# Patient Record
Sex: Male | Born: 1990 | Race: Black or African American | Hispanic: No | Marital: Single | Smoking: Current every day smoker
Health system: Southern US, Community
[De-identification: ages and names within clinical notes are randomized; demographics above are authoritative.]

## PROBLEM LIST (undated history)

## (undated) HISTORY — PX: JOINT REPLACEMENT: SHX530

---
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
## 2009-05-13 ENCOUNTER — Emergency Department (HOSPITAL_COMMUNITY): Admission: EM | Admit: 2009-05-13 | Discharge: 2009-05-13 | Payer: Self-pay | Admitting: Family Medicine
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
## 2011-07-16 ENCOUNTER — Emergency Department (HOSPITAL_COMMUNITY): Payer: No Typology Code available for payment source

## 2011-07-16 ENCOUNTER — Encounter (HOSPITAL_COMMUNITY): Payer: Self-pay | Admitting: Emergency Medicine

## 2011-07-16 ENCOUNTER — Emergency Department (HOSPITAL_COMMUNITY)
Admission: EM | Admit: 2011-07-16 | Discharge: 2011-07-16 | Disposition: A | Discharge status: Home / Self Care | Payer: No Typology Code available for payment source | Attending: Emergency Medicine | Admitting: Emergency Medicine

## 2011-07-16 DIAGNOSIS — S8000XA Contusion of unspecified knee, initial encounter: Secondary | ICD-10-CM | POA: Insufficient documentation

## 2011-07-16 DIAGNOSIS — Y93I9 Activity, other involving external motion: Secondary | ICD-10-CM | POA: Insufficient documentation

## 2011-07-16 DIAGNOSIS — Y998 Other external cause status: Secondary | ICD-10-CM | POA: Insufficient documentation

## 2011-07-16 DIAGNOSIS — R079 Chest pain, unspecified: Secondary | ICD-10-CM | POA: Insufficient documentation

## 2011-07-16 DIAGNOSIS — F17200 Nicotine dependence, unspecified, uncomplicated: Secondary | ICD-10-CM | POA: Insufficient documentation

## 2011-07-16 MED ORDER — LIDOCAINE VISCOUS 2 % MT SOLN: 20.0000 mL | Freq: Once | OROMUCOSAL | Status: DC
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
## 2011-07-17 NOTE — ED Provider Notes (Signed)
Medical screening examination/treatment/procedure(s) were performed by non-physician practitioner and as supervising physician I was immediately available for consultation/collaboration.  Waylen Depaolo, MD 07/17/11 1509 

## 2012-09-12 ENCOUNTER — Emergency Department (HOSPITAL_COMMUNITY): Payer: Self-pay

## 2012-09-12 ENCOUNTER — Emergency Department (HOSPITAL_COMMUNITY)
Admission: EM | Admit: 2012-09-12 | Discharge: 2012-09-12 | Disposition: A | Discharge status: Home / Self Care | Payer: Self-pay | Attending: Emergency Medicine | Admitting: Emergency Medicine

## 2012-09-12 ENCOUNTER — Encounter (HOSPITAL_COMMUNITY): Payer: Self-pay | Admitting: *Deleted

## 2012-09-12 DIAGNOSIS — E669 Obesity, unspecified: Secondary | ICD-10-CM | POA: Insufficient documentation

## 2012-09-12 DIAGNOSIS — J3489 Other specified disorders of nose and nasal sinuses: Secondary | ICD-10-CM | POA: Insufficient documentation

## 2012-09-12 DIAGNOSIS — R1110 Vomiting, unspecified: Secondary | ICD-10-CM | POA: Insufficient documentation

## 2012-09-12 DIAGNOSIS — D72829 Elevated white blood cell count, unspecified: Secondary | ICD-10-CM | POA: Insufficient documentation

## 2012-09-12 DIAGNOSIS — J029 Acute pharyngitis, unspecified: Secondary | ICD-10-CM | POA: Insufficient documentation

## 2012-09-12 DIAGNOSIS — R509 Fever, unspecified: Secondary | ICD-10-CM | POA: Insufficient documentation

## 2012-09-12 DIAGNOSIS — F17200 Nicotine dependence, unspecified, uncomplicated: Secondary | ICD-10-CM | POA: Insufficient documentation

## 2012-09-12 LAB — CBC WITH DIFFERENTIAL/PLATELET
Basophils Absolute: 0 10*3/uL (ref 0.0–0.1)
HCT: 43.9 % (ref 39.0–52.0)
Hemoglobin: 15.8 g/dL (ref 13.0–17.0)
Lymphocytes Relative: 7 % — ABNORMAL LOW (ref 12–46)
Lymphs Abs: 1.6 10*3/uL (ref 0.7–4.0)
MCV: 81.8 fL (ref 78.0–100.0)
Monocytes Absolute: 1.4 10*3/uL — ABNORMAL HIGH (ref 0.1–1.0)
Neutro Abs: 19.4 10*3/uL — ABNORMAL HIGH (ref 1.7–7.7)
RBC: 5.37 MIL/uL (ref 4.22–5.81)
RDW: 12.6 % (ref 11.5–15.5)
WBC: 22.5 10*3/uL — ABNORMAL HIGH (ref 4.0–10.5)

## 2012-09-12 LAB — COMPREHENSIVE METABOLIC PANEL
ALT: 16 U/L (ref 0–53)
AST: 15 U/L (ref 0–37)
CO2: 27 mEq/L (ref 19–32)
Chloride: 100 mEq/L (ref 96–112)
Creatinine, Ser: 0.91 mg/dL (ref 0.50–1.35)
GFR calc Af Amer: >90 mL/min (ref >90)
GFR calc non Af Amer: >90 mL/min (ref >90)
Glucose, Bld: 102 mg/dL — ABNORMAL HIGH (ref 70–99)
Total Bilirubin: 0.6 mg/dL (ref 0.3–1.2)

## 2012-09-12 MED ORDER — AZITHROMYCIN 250 MG PO TABS
500.0000 mg | ORAL_TABLET | Freq: Once | ORAL | Status: AC
  Administered 2012-09-12: 500 mg via ORAL
  Filled 2012-09-12: qty 2

## 2012-09-12 MED ORDER — AZITHROMYCIN 250 MG PO TABS: ORAL_TABLET | ORAL | Status: AC

## 2012-09-12 NOTE — ED Notes (Signed)
Reports onset last night of cough and vomiting. No distress noted at triage.

## 2012-09-12 NOTE — ED Provider Notes (Addendum)
History     CSN: [id number]  Arrival date & time 09/12/12  1830   First MD Initiated Contact with Patient 09/12/12 1911      Chief Complaint  Patient presents with  . Emesis  . Cough    (Consider location/radiation/quality/duration/timing/severity/associated sxs/prior treatment) HPI Complains of cough, nonproductive, nasal congestion and sore throat onset 3 AM today. Treated with ibuprofen with partial relief 2 episodes of posttussive vomiting. No other complaint. No shortness of breath. Nothing makes symptoms better or worse. History reviewed. No pertinent past medical history.  Past Surgical History  Procedure Laterality Date  . Joint replacement     no history of joint replacement, history of tendon repair left knee  History reviewed. No pertinent family history.  History  Substance Use Topics  . Smoking status: Current Every Day Smoker  . Smokeless tobacco: Not on file  . Alcohol Use: Yes   No illicit drug use   Review of Systems  HENT: Positive for congestion and sore throat.        Nasal congestion  Respiratory: Positive for cough.   Gastrointestinal: Positive for vomiting.        2 episodes of posttussive vomiting    Allergies  Tylenol  Home Medications   Current Outpatient Rx  Name  Route  Sig  Dispense  Refill  . ibuprofen (ADVIL,MOTRIN) 200 MG tablet   Oral   Take 400 mg by mouth every 6 (six) hours as needed. For headache/pain           BP 139/81  Pulse 112  Temp(Src) 100.5 F (38.1 C) (Oral)  Resp 19  SpO2 98%  Physical Exam  Nursing note and vitals reviewed. Constitutional: He appears well-developed and well-nourished. No distress.  HENT:  Head: Normocephalic and atraumatic.  Oral pharynx reddened, uvula midline, no exudate  Eyes: Conjunctivae are normal. Pupils are equal, round, and reactive to light.  Neck: Neck supple. No tracheal deviation present. No thyromegaly present.  Cardiovascular: Normal rate and regular rhythm.   No  murmur heard. Pulmonary/Chest: Effort normal and breath sounds normal. No respiratory distress.  Occasional cough  Abdominal: Soft. Bowel sounds are normal. He exhibits no distension. There is no tenderness.  OBese  Musculoskeletal: Normal range of motion. He exhibits no edema and no tenderness.  Lymphadenopathy:    He has no cervical adenopathy.  Neurological: He is alert. Coordination normal.  Skin: Skin is warm and dry. No rash noted.  Psychiatric: He has a normal mood and affect.    ED Course  Procedures (including critical care time)  Labs Reviewed  CBC WITH DIFFERENTIAL - Abnormal; Notable for the following:    WBC 22.5 (*)    Neutrophils Relative % 86 (*)    Neutro Abs 19.4 (*)    Lymphocytes Relative 7 (*)    Monocytes Absolute 1.4 (*)    All other components within normal limits  COMPREHENSIVE METABOLIC PANEL  LIPASE, BLOOD   No results found.   No diagnosis found. Results for orders placed during the hospital encounter of 09/12/12  CBC WITH DIFFERENTIAL      Result Value Range   WBC 22.5 (*) 4.0 - 10.5 K/uL   RBC 5.37  4.22 - 5.81 MIL/uL   Hemoglobin 15.8  13.0 - 17.0 g/dL   HCT 40.9  81.1 - 91.4 %   MCV 81.8  78.0 - 100.0 fL   MCH 29.4  26.0 - 34.0 pg   MCHC 36.0  30.0 - 36.0 g/dL  RDW 12.6  11.5 - 15.5 %   Platelets 218  150 - 400 K/uL   Neutrophils Relative % 86 (*) 43 - 77 %   Neutro Abs 19.4 (*) 1.7 - 7.7 K/uL   Lymphocytes Relative 7 (*) 12 - 46 %   Lymphs Abs 1.6  0.7 - 4.0 K/uL   Monocytes Relative 6  3 - 12 %   Monocytes Absolute 1.4 (*) 0.1 - 1.0 K/uL   Eosinophils Relative 1  0 - 5 %   Eosinophils Absolute 0.1  0.0 - 0.7 K/uL   Basophils Relative 0  0 - 1 %   Basophils Absolute 0.0  0.0 - 0.1 K/uL  COMPREHENSIVE METABOLIC PANEL      Result Value Range   Sodium 138  135 - 145 mEq/L   Potassium 3.9  3.5 - 5.1 mEq/L   Chloride 100  96 - 112 mEq/L   CO2 27  19 - 32 mEq/L   Glucose, Bld 102 (*) 70 - 99 mg/dL   BUN 11  6 - 23 mg/dL    Creatinine, Ser 1.61  0.50 - 1.35 mg/dL   Calcium 09.6  8.4 - 04.5 mg/dL   Total Protein 8.2  6.0 - 8.3 g/dL   Albumin 4.2  3.5 - 5.2 g/dL   AST 15  0 - 37 U/L   ALT 16  0 - 53 U/L   Alkaline Phosphatase 122 (*) 39 - 117 U/L   Total Bilirubin 0.6  0.3 - 1.2 mg/dL   GFR calc non Af Amer >90  >90 mL/min   GFR calc Af Amer >90  >90 mL/min  LIPASE, BLOOD      Result Value Range   Lipase 11  11 - 59 U/L   Dg Chest 2 View  09/12/2012   *RADIOLOGY REPORT*  Clinical Data: Emesis and cough  CHEST - 2 VIEW  Comparison: 07/16/2011  Findings: The heart size and mediastinal contours are within normal limits.  Both lungs are clear.  The visualized skeletal structures are unremarkable.  IMPRESSION: Negative exam.   Original Report Authenticated By: Signa Kell, M.D.     Chest x-ray viewed by me MDM  In light of leukocytosis treat for atypical pneumonia with patient vomiting. Posttussive vomiting possibly suggestive of pertussis. Patient is not ill-appearing and degree of leukocytosis does not correlate clinically to his illness And prescription Zithromax Referral resource guide Spent 5 minutes counseling patient on smoking cessation Diagnosis #1 febrile illness #2 tobacco abuse #3 leukocytosis        Doug Sou, MD 09/12/12 2128  Doug Sou, MD 09/12/12 2129  Doug Sou, MD 09/12/12 2129

## 2012-09-12 NOTE — ED Notes (Signed)
Patient states he started feeling bad yesterday.  Stated his SO had the same thing last Thursday and now he is feeling bad.  +cough and states that sometimes when he coughs he coughs so hard that he vomits.  Lungs clear bilaterally.  Sounds congested.

## 2013-10-12 ENCOUNTER — Encounter (HOSPITAL_COMMUNITY): Payer: Self-pay | Admitting: Emergency Medicine

## 2013-10-12 ENCOUNTER — Emergency Department (HOSPITAL_COMMUNITY)
Admission: EM | Admit: 2013-10-12 | Discharge: 2013-10-12 | Disposition: A | Discharge status: Home / Self Care | Payer: Self-pay | Attending: Emergency Medicine | Admitting: Emergency Medicine

## 2013-10-12 DIAGNOSIS — F17200 Nicotine dependence, unspecified, uncomplicated: Secondary | ICD-10-CM | POA: Insufficient documentation

## 2013-10-12 DIAGNOSIS — J029 Acute pharyngitis, unspecified: Secondary | ICD-10-CM

## 2013-10-12 MED ORDER — AZITHROMYCIN 250 MG PO TABS
250.0000 mg | ORAL_TABLET | Freq: Every day | ORAL | Status: AC
Start: 2013-10-12 — End: ?

## 2013-10-12 NOTE — Discharge Instructions (Signed)
Sore Throat A sore throat is pain, burning, irritation, or scratchiness of the throat. There is often pain or tenderness when swallowing or talking. A sore throat may be accompanied by other symptoms, such as coughing, sneezing, fever, and swollen neck glands. A sore throat is often the first sign of another sickness, such as a cold, flu, strep throat, or mononucleosis (commonly known as mono). Most sore throats go away without medical treatment. CAUSES  The most common causes of a sore throat include:  A viral infection, such as a cold, flu, or mono.  A bacterial infection, such as strep throat, tonsillitis, or whooping cough.  Seasonal allergies.  Dryness in the air.  Irritants, such as smoke or pollution.  Gastroesophageal reflux disease (GERD). HOME CARE INSTRUCTIONS   Only take over-the-counter medicines as directed by your caregiver.  Drink enough fluids to keep your urine clear or pale yellow.  Rest as needed.  Try using throat sprays, lozenges, or sucking on hard candy to ease any pain (if older than 4 years or as directed).  Sip warm liquids, such as broth, herbal tea, or warm water with honey to relieve pain temporarily. You may also eat or drink cold or frozen liquids such as frozen ice pops.  Gargle with salt water (mix 1 tsp salt with 8 oz of water).  Do not smoke and avoid secondhand smoke.  Put a cool-mist humidifier in your bedroom at night to moisten the air. You can also turn on a hot shower and sit in the bathroom with the door closed for 5 10 minutes. SEEK IMMEDIATE MEDICAL CARE IF:  You have difficulty breathing.  You are unable to swallow fluids, soft foods, or your saliva.  You have increased swelling in the throat.  Your sore throat does not get better in 7 days.  You have nausea and vomiting.  You have a fever or persistent symptoms for more than 2 3 days.  You have a fever and your symptoms suddenly get worse. MAKE SURE YOU:   Understand  these instructions.  Will watch your condition.  Will get help right away if you are not doing well or get worse. Document Released: 05/23/2004 Document Revised: 04/01/2012 Document Reviewed: 12/22/2011 [employer] Patient Information 2014 [employer].  

## 2013-10-12 NOTE — ED Provider Notes (Signed)
CSN: [id number]     Arrival date & time 10/12/13  1030 History  This chart was scribed for non-physician practitioner, Ivar Drapeob Browning, working with Joya Gaskinsonald W Wickline, MD by Milly JakobJohn Lee Graves, ED Scribe. The patient was seen in room TR06C/TR06C. Patient's care was started at 11:04 AM.     Chief Complaint  Patient presents with  . Sore Throat  . Chills   The history is provided by the patient. No language interpreter was used.   HPI Comments: Hector Porter is a 23 y.o. male who presents to the Emergency Department with a chief complaint of sore throat, and ear congestion. He states symptoms have been ongoing for the past 2 days. He rates his pain as a 4/10. He states that the pain has been progressively worsening. He reports associated fevers and chills, but did not take a temperature. He is tried taking ibuprofen with some relief. He denies cough or congestion.  History reviewed. No pertinent past medical history. Past Surgical History  Procedure Laterality Date  . Joint replacement     History reviewed. No pertinent family history. History  Substance Use Topics  . Smoking status: Current Every Day Smoker  . Smokeless tobacco: Not on file  . Alcohol Use: Yes    Review of Systems  Constitutional: Positive for fever and chills.  HENT: Positive for sore throat. Negative for drooling and postnasal drip.   Respiratory: Negative for cough and shortness of breath.   Gastrointestinal: Negative for nausea, vomiting, diarrhea and constipation.      Allergies  Tylenol  Home Medications   Prior to Admission medications   Medication Sig Start Date End Date Taking? Authorizing Provider  azithromycin (ZITHROMAX) 250 MG tablet One by mouth daily 09/12/12   Doug SouSam Jacubowitz, MD  ibuprofen (ADVIL,MOTRIN) 200 MG tablet Take 400 mg by mouth every 6 (six) hours as needed. For headache/pain    Historical Provider, MD   Triage Vitals: BP 155/89  Pulse 85  Temp(Src) 98.3 F (36.8 C) (Oral)  Resp  20  Ht 5\' 10"  (1.778 m)  Wt 287 lb (130.182 kg)  BMI 41.18 kg/m2  SpO2 98% Physical Exam  Nursing note and vitals reviewed. Constitutional: He is oriented to person, place, and time. He appears well-developed and well-nourished. No distress.  HENT:  Head: Normocephalic and atraumatic.  Oropharynx is moderately erythematous, no tonsillar exudates, uvula is midline, airway is intact, left tympanic membrane is moderately erythematous, right is clear  Eyes: Conjunctivae and EOM are normal. Pupils are equal, round, and reactive to light. Right eye exhibits no discharge. Left eye exhibits no discharge. No scleral icterus.  Neck: Normal range of motion. Neck supple. No JVD present. No tracheal deviation present.  Cardiovascular: Normal rate, regular rhythm and normal heart sounds.  Exam reveals no gallop and no friction rub.   No murmur heard. Pulmonary/Chest: Effort normal and breath sounds normal. No respiratory distress. He has no wheezes. He has no rales. He exhibits no tenderness.  Abdominal: Soft. He exhibits no distension and no mass. There is no tenderness. There is no rebound and no guarding.  Musculoskeletal: Normal range of motion. He exhibits no edema and no tenderness.  Neurological: He is alert and oriented to person, place, and time.  Skin: Skin is warm and dry.  Psychiatric: He has a normal mood and affect. His behavior is normal. Judgment and thought content normal.    ED Course  Procedures (including critical care time) DIAGNOSTIC STUDIES: Oxygen Saturation is 98% on  room air, normal by my interpretation.      Labs Review Labs Reviewed - No data to display  Imaging Review No results found.   EKG Interpretation None      MDM   Final diagnoses:  Sore throat    Patient with pharyngitis, with subjective fevers and chills.   I personally performed the services described in this documentation, which was scribed in my presence. The recorded information has been  reviewed and is accurate.     Roxy Horsemanobert Browning, PA-C 10/12/13 1148

## 2013-10-12 NOTE — ED Notes (Signed)
Pt presents to department for evaluation of sore throat and chills. Ongoing x2 days. 4/10 pain at the time. Pt is alert and oriented x4. NAD.

## 2013-10-13 NOTE — ED Provider Notes (Signed)
Medical screening examination/treatment/procedure(s) were performed by non-physician practitioner and as supervising physician I was immediately available for consultation/collaboration.   EKG Interpretation None        Donald W Wickline, MD 10/13/13 2013 

## 2014-02-01 ENCOUNTER — Emergency Department (INDEPENDENT_AMBULATORY_CARE_PROVIDER_SITE_OTHER)
Admission: EM | Admit: 2014-02-01 | Discharge: 2014-02-01 | Disposition: A | Discharge status: Home / Self Care | Payer: Self-pay | Source: Home / Self Care | Attending: Family Medicine | Admitting: Family Medicine

## 2014-02-01 ENCOUNTER — Other Ambulatory Visit (HOSPITAL_COMMUNITY)
Admission: RE | Admit: 2014-02-01 | Discharge: 2014-02-01 | Disposition: A | Discharge status: Home / Self Care | Payer: Self-pay | Source: Ambulatory Visit | Attending: Family Medicine | Admitting: Family Medicine

## 2014-02-01 ENCOUNTER — Encounter (HOSPITAL_COMMUNITY): Payer: Self-pay | Admitting: Emergency Medicine

## 2014-02-01 DIAGNOSIS — Z202 Contact with and (suspected) exposure to infections with a predominantly sexual mode of transmission: Secondary | ICD-10-CM

## 2014-02-01 DIAGNOSIS — Z113 Encounter for screening for infections with a predominantly sexual mode of transmission: Secondary | ICD-10-CM | POA: Insufficient documentation

## 2014-02-01 MED ORDER — CEFTRIAXONE SODIUM 250 MG IJ SOLR
250.0000 mg | Freq: Once | INTRAMUSCULAR | Status: AC
  Administered 2014-02-01: 250 mg via INTRAMUSCULAR

## 2014-02-01 MED ORDER — AZITHROMYCIN 250 MG PO TABS
1000.0000 mg | ORAL_TABLET | Freq: Once | ORAL | Status: AC
  Administered 2014-02-01: 1000 mg via ORAL

## 2014-02-01 MED ORDER — AZITHROMYCIN 250 MG PO TABS
ORAL_TABLET | ORAL | Status: AC
  Filled 2014-02-01: qty 4

## 2014-02-01 MED ORDER — CEFTRIAXONE SODIUM 250 MG IJ SOLR
INTRAMUSCULAR | Status: AC
  Filled 2014-02-01: qty 250

## 2014-02-01 NOTE — Discharge Instructions (Signed)
Sexually Transmitted Disease °A sexually transmitted disease (STD) is a disease or infection that may be passed (transmitted) from person to person, usually during sexual activity. This may happen by way of saliva, semen, blood, vaginal mucus, or urine. Common STDs include:  °· Gonorrhea.   °· Chlamydia.   °· Syphilis.   °· HIV and AIDS.   °· Genital herpes.   °· Hepatitis B and C.   °· Trichomonas.   °· Human papillomavirus (HPV).   °· Pubic lice.   °· Scabies. °· Mites. °· Bacterial vaginosis. °WHAT ARE CAUSES OF STDs? °An STD may be caused by bacteria, a virus, or parasites. STDs are often transmitted during sexual activity if one person is infected. However, they may also be transmitted through nonsexual means. STDs may be transmitted after:  °· Sexual intercourse with an infected person.   °· Sharing sex toys with an infected person.   °· Sharing needles with an infected person or using unclean piercing or tattoo needles. °· Having intimate contact with the genitals, mouth, or rectal areas of an infected person.   °· Exposure to infected fluids during birth. °WHAT ARE THE SIGNS AND SYMPTOMS OF STDs? °Different STDs have different symptoms. Some people may not have any symptoms. If symptoms are present, they may include:  °· Painful or bloody urination.   °· Pain in the pelvis, abdomen, vagina, anus, throat, or eyes.   °· A skin rash, itching, or irritation. °· Growths, ulcerations, blisters, or sores in the genital and anal areas. °· Abnormal vaginal discharge with or without bad odor.   °· Penile discharge in men.   °· Fever.   °· Pain or bleeding during sexual intercourse.   °· Swollen glands in the groin area.   °· Yellow skin and eyes (jaundice). This is seen with hepatitis.   °· Swollen testicles. °· Infertility. °· Sores and blisters in the mouth. °HOW ARE STDs DIAGNOSED? °To make a diagnosis, your health care provider may:  °· Take a medical history.   °· Perform a physical exam.   °· Take a sample of  any discharge to examine. °· Swab the throat, cervix, opening to the penis, rectum, or vagina for testing. °· Test a sample of your first morning urine.   °· Perform blood tests.   °· Perform a Pap test, if this applies.   °· Perform a colposcopy.   °· Perform a laparoscopy.   °HOW ARE STDs TREATED? ° Treatment depends on the STD. Some STDs may be treated but not cured.  °· Chlamydia, gonorrhea, trichomonas, and syphilis can be cured with antibiotic medicine.   °· Genital herpes, hepatitis, and HIV can be treated, but not cured, with prescribed medicines. The medicines lessen symptoms.   °· Genital warts from HPV can be treated with medicine or by freezing, burning (electrocautery), or surgery. Warts may come back.   °· HPV cannot be cured with medicine or surgery. However, abnormal areas may be removed from the cervix, vagina, or vulva.   °· If your diagnosis is confirmed, your recent sexual partners need treatment. This is true even if they are symptom-free or have a negative culture or evaluation. They should not have sex until their health care providers say it is okay. °HOW CAN I REDUCE MY RISK OF GETTING AN STD? °Take these steps to reduce your risk of getting an STD: °· Use latex condoms, dental dams, and water-soluble lubricants during sexual activity. Do not use petroleum jelly or oils. °· Avoid having multiple sex partners. °· Do not have sex with someone who has other sex partners. °· Do not have sex with anyone you do not know or who is at   high risk for an STD. °· Avoid risky sex practices that can break your skin. °· Do not have sex if you have open sores on your mouth or skin. °· Avoid drinking too much alcohol or taking illegal drugs. Alcohol and drugs can affect your judgment and put you in a vulnerable position. °· Avoid engaging in oral and anal sex acts. °· Get vaccinated for HPV and hepatitis. If you have not received these vaccines in the past, talk to your health care provider about whether one  or both might be right for you.   °· If you are at risk of being infected with HIV, it is recommended that you take a prescription medicine daily to prevent HIV infection. This is called pre-exposure prophylaxis (PrEP). You are considered at risk if: °¨ You are a man who has sex with other men (MSM). °¨ You are a heterosexual man or woman and are sexually active with more than one partner. °¨ You take drugs by injection. °¨ You are sexually active with a partner who has HIV. °· Talk with your health care provider about whether you are at high risk of being infected with HIV. If you choose to begin PrEP, you should first be tested for HIV. You should then be tested every 3 months for as long as you are taking PrEP.   °WHAT SHOULD I DO IF I THINK I HAVE AN STD? °· See your health care provider.   °· Tell your sexual partner(s). They should be tested and treated for any STDs. °· Do not have sex until your health care provider says it is okay.  °WHEN SHOULD I GET IMMEDIATE MEDICAL CARE? °Contact your health care provider right away if:  °· You have severe abdominal pain. °· You are a man and notice swelling or pain in your testicles. °· You are a woman and notice swelling or pain in your vagina. °Document Released: 07/06/2002 Document Revised: 04/20/2013 Document Reviewed: 11/03/2012 °ExitCare® Patient Information ©2015 [employer]. This information is not intended to replace advice given to you by your health care provider. Make sure you discuss any questions you have with your health care provider. ° °Safe Sex °Safe sex is about reducing the risk of giving or getting a sexually transmitted disease (STD). STDs are spread through sexual contact involving the genitals, mouth, or rectum. Some STDs can be cured and others cannot. Safe sex can also prevent unintended pregnancies.  °WHAT ARE SOME SAFE SEX PRACTICES? °· Limit your sexual activity to only one partner who is having sex with only you. °· Talk to your partner  about his or her past partners, past STDs, and drug use. °· Use a condom every time you have sexual intercourse. This includes vaginal, oral, and anal sexual activity. Both females and males should wear condoms during oral sex. Only use latex or polyurethane condoms and water-based lubricants. Using petroleum-based lubricants or oils to lubricate a condom will weaken the condom and increase the chance that it will break. The condom should be in place from the beginning to the end of sexual activity. Wearing a condom reduces, but does not completely eliminate, your risk of getting or giving an STD. STDs can be spread by contact with infected body fluids and skin. °· Get vaccinated for hepatitis B and HPV. °· Avoid alcohol and recreational drugs, which can affect your judgment. You may forget to use a condom or participate in high-risk sex. °· For females, avoid douching after sexual intercourse. Douching can spread an infection   farther into the reproductive tract. °· Check your body for signs of sores, blisters, rashes, or unusual discharge. See your health care provider if you notice any of these signs. °· Avoid sexual contact if you have symptoms of an infection or are being treated for an STD. If you or your partner has herpes, avoid sexual contact when blisters are present. Use condoms at all other times. °· If you are at risk of being infected with HIV, it is recommended that you take a prescription medicine daily to prevent HIV infection. This is called pre-exposure prophylaxis (PrEP). You are considered at risk if: °¨ You are a man who has sex with other men (MSM). °¨ You are a heterosexual man or woman who is sexually active with more than one partner. °¨ You take drugs by injection. °¨ You are sexually active with a partner who has HIV. °· Talk with your health care provider about whether you are at high risk of being infected with HIV. If you choose to begin PrEP, you should first be tested for HIV. You  should then be tested every 3 months for as long as you are taking PrEP. °· See your health care provider for regular screenings, exams, and tests for other STDs. Before having sex with a new partner, each of you should be screened for STDs and should talk about the results with each other. °WHAT ARE THE BENEFITS OF SAFE SEX?  °· There is less chance of getting or giving an STD. °· You can prevent unwanted or unintended pregnancies. °· By discussing safe sex concerns with your partner, you may increase feelings of intimacy, comfort, trust, and honesty between the two of you. °Document Released: 05/23/2004 Document Revised: 08/30/2013 Document Reviewed: 10/07/2011 °ExitCare® Patient Information ©2015 [employer]. This information is not intended to replace advice given to you by your health care provider. Make sure you discuss any questions you have with your health care provider. ° °

## 2014-02-01 NOTE — ED Notes (Signed)
Pt  Reports was  Told  By  A  Partner  That  They  Tested  Pos  For  chlymydia  Pt  denys  Any   Symptoms

## 2014-02-01 NOTE — ED Provider Notes (Signed)
CSN: [id number]     Arrival date & time 02/01/14  0940 History   None    Chief Complaint  Patient presents with  . Exposure to STD   (Consider location/radiation/quality/duration/timing/severity/associated sxs/prior Treatment) HPI Comments: Was notified by partner that she tested positive for chlamydia. Patient reports himself to be asymptomatic and otherwise healthy.  PCP: none Works at [employer]  Patient is a 23 y.o. male presenting with STD exposure. The history is provided by the patient.  Exposure to STD This is a new problem.    History reviewed. No pertinent past medical history. Past Surgical History  Procedure Laterality Date  . Joint replacement     History reviewed. No pertinent family history. History  Substance Use Topics  . Smoking status: Current Every Day Smoker  . Smokeless tobacco: Not on file  . Alcohol Use: Yes    Review of Systems  All other systems reviewed and are negative.   Allergies  Tylenol  Home Medications   Prior to Admission medications   Medication Sig Start Date End Date Taking? Authorizing Provider  azithromycin (ZITHROMAX Z-PAK) 250 MG tablet Take 1 tablet (250 mg total) by mouth daily. 500mg  PO day 1, then 250mg  PO days 205 10/12/13   Roxy Horsemanobert Browning, PA-C  azithromycin [facility](ZITHROMAX) 250 MG tablet One by mouth daily 09/12/12   Doug SouSam Jacubowitz, MD  ibuprofen (ADVIL,MOTRIN) 200 MG tablet Take 400 mg by mouth every 6 (six) hours as needed. For headache/pain    Historical Provider, MD   BP 142/97  Pulse 73  Temp(Src) 97.9 F (36.6 C) (Oral)  Resp 18  Ht 5\' 9"  (1.753 m)  Wt 280 lb (127.007 kg)  BMI 41.33 kg/m2  SpO2 98% Physical Exam  Nursing note and vitals reviewed. Constitutional: He is oriented to person, place, and time. He appears well-nourished. No distress.  +obese  HENT:  Head: Normocephalic and atraumatic.  Eyes: Conjunctivae are normal. No scleral icterus.  Cardiovascular: Normal rate.   Pulmonary/Chest: Effort  normal.  Genitourinary:  Declines exam  Musculoskeletal: Normal range of motion.  Neurological: He is alert and oriented to person, place, and time.  Skin: Skin is warm and dry. No rash noted. No erythema.  Psychiatric: He has a normal mood and affect. His behavior is normal.    ED Course  Procedures (including critical care time) Labs Review Labs Reviewed  URINE CYTOLOGY ANCILLARY ONLY    Imaging Review No results found.   MDM   1. Exposure to STD    Urine cytology sent for gonorrhea, chlamydia and trichomonas. Patient declines RPR and HIV testing. Will treat empirically for gonorrhea and chlamydia while in clinic today with ceftriazone 250mg  IM and azithromycin 1 gram po. [facility]    Ria ClockJennifer Lee H Felis Quillin, [place] 02/01/14 1038

## 2014-02-01 NOTE — ED Provider Notes (Signed)
Medical screening examination/treatment/procedure(s) were performed by resident physician or non-physician practitioner and as supervising physician I was immediately available for consultation/collaboration.   Lawanda Holzheimer DOUGLAS MD.   Makyia Erxleben D Jaylean Buenaventura, MD 02/01/14 1138 

## 2014-02-02 LAB — URINE CYTOLOGY ANCILLARY ONLY
Chlamydia: NEGATIVE
NEISSERIA GONORRHEA: NEGATIVE
TRICH (WINDOWPATH): NEGATIVE

## 2015-11-10 ENCOUNTER — Ambulatory Visit (HOSPITAL_COMMUNITY)
Admission: EM | Admit: 2015-11-10 | Discharge: 2015-11-10 | Disposition: A | Discharge status: Home / Self Care | Payer: Self-pay | Attending: Internal Medicine | Admitting: Internal Medicine

## 2015-11-10 ENCOUNTER — Encounter (HOSPITAL_COMMUNITY): Payer: Self-pay | Admitting: Emergency Medicine

## 2015-11-10 DIAGNOSIS — L259 Unspecified contact dermatitis, unspecified cause: Secondary | ICD-10-CM

## 2015-11-10 MED ORDER — CLOBETASOL PROPIONATE 0.05 % EX OINT: 1.0000 "application " | TOPICAL_OINTMENT | Freq: Two times a day (BID) | CUTANEOUS | Status: AC

## 2015-11-10 NOTE — Discharge Instructions (Signed)
Prescription for clobetasol (steroid) ointment was sent to the [facility] on [address] [facility] [place]. New spots may continue to pop up over the next several days.  Use ointment to help dry them up. Wash sheets and towels in hot water with a little bleach.  Recheck if having uncontrollable itching, fever >100.5, mouth/eye/genital involvement, or if having new spots pop up after the next week or two.  Contact Dermatitis Dermatitis is redness, soreness, and swelling (inflammation) of the skin. Contact dermatitis is a reaction to certain substances that touch the skin. You either touched something that irritated your skin, or you have allergies to something you touched.  HOME CARE  Skin Care  Moisturize your skin as needed.  Apply cool compresses to the affected areas.   Try taking a bath with:   Epsom salts. Follow the instructions on the package. You can get these at a pharmacy or grocery store.   Baking soda. Pour a small amount into the bath as told by your doctor.   Colloidal oatmeal. Follow the instructions on the package. You can get this at a pharmacy or grocery store.   Try applying baking soda paste to your skin. Stir water into baking soda until it looks like paste.  Do not scratch your skin.   Bathe less often.  Bathe in lukewarm water. Avoid using hot water.  Medicines  Take or apply over-the-counter and prescription medicines only as told by your doctor.   If you were prescribed an antibiotic medicine, take or apply your antibiotic as told by your doctor. Do not stop taking the antibiotic even if your condition starts to get better. General Instructions  Keep all follow-up visits as told by your doctor. This is important.   Avoid the substance that caused your reaction. If you do not know what caused it, keep a journal to try to track what caused it. Write down:   What you eat.   What cosmetic products you use.   What you drink.   What you wear in the  affected area. This includes jewelry.   If you were given a bandage (dressing), take care of it as told by your doctor. This includes when to change and remove it.  GET HELP IF:   You do not get better with treatment.   Your condition gets worse.   You have signs of infection such as:  Swelling.  Tenderness.  Redness.  Soreness.  Warmth.   You have a fever.   You have new symptoms.  GET HELP RIGHT AWAY IF:   You have a very bad headache.  You have neck pain.  Your neck is stiff.   You throw up (vomit).   You feel very sleepy.   You see red streaks coming from the affected area.   Your bone or joint underneath the affected area becomes painful after the skin has healed.   The affected area turns darker.   You have trouble breathing.    This information is not intended to replace advice given to you by your health care provider. Make sure you discuss any questions you have with your health care provider.   Document Released: 02/10/2009 Document Revised: 01/04/2015 Document Reviewed: 08/31/2014 Elsevier Interactive Patient Education [employer]2016 Elsevier Inc.

## 2015-11-10 NOTE — ED Notes (Signed)
PT has a rash or insect bites over arms and right leg. PT reports he has not done anything differently lately. PT reports area does not itch. PT reports he first noticed it two days ago .

## 2015-11-10 NOTE — ED Provider Notes (Addendum)
CSN: [id number]     Arrival date & time 11/10/15  [id number] History   First MD Initiated Contact with Patient 11/10/15 1011     Chief Complaint  Patient presents with  . Rash   HPI  Patient is a 25 year old who presents with bumps on his arms, a couple on his belly, and in the right groin.  He was outside under the trees several days ago, and may have been exposed poison ivy.  No new foods, personal care products, medications. Reports that he tends to have sensitive skin. Intermittently has difficulty with a pimply rash on his upper legs and groins, which is different than today's presentation. No fever, no malaise. No unusual joint pains, achiness. No nausea/vomiting.  History reviewed. No pertinent past medical history. Past Surgical History  Procedure Laterality Date  . Joint replacement     No family history on file. Social History  Substance Use Topics  . Smoking status: Current Every Day Smoker    Types: Cigars  . Smokeless tobacco: None  . Alcohol Use: Yes     Comment: weekends    Review of Systems  All other systems reviewed and are negative.   Allergies  Tylenol  Home Medications   Takes no meds regularly    BP 131/87 mmHg  Pulse 60  Temp(Src) 98.3 F (36.8 C) (Oral)  Resp 16  SpO2 100% Physical Exam  Constitutional: He is oriented to person, place, and time. No distress.  Alert, nicely groomed  HENT:  Head: Atraumatic.  Eyes:  Conjugate gaze, no eye redness/drainage  Neck: Neck supple.  Cardiovascular: Normal rate.   Pulmonary/Chest: No respiratory distress.  Abdominal: He exhibits no distension.  Musculoskeletal: Normal range of motion.  Neurological: He is alert and oriented to person, place, and time.  Skin: Skin is warm and dry.  No cyanosis Papular/erythematous areas with a few streaks, some with fine vesiculation. Minimal itching. Consistent with contact dermatitis  Nursing note and vitals reviewed.   ED Course  Procedures (including  critical care time)  none  MDM   1. Contact dermatitis    Meds ordered this encounter  Medications  . clobetasol ointment (TEMOVATE) 0.05 %    Sig: Apply 1 application topically 2 (two) times daily.    Dispense:  45 g    Refill:  0   Wash sheets/towels/frequently worn clothing in hot water with a little bleach. Recheck as needed.    Prescription for clobetasol (steroid) ointment [facility] [address] [facility] [address]. New spots may continue to pop up over the next several days.  Use ointment to help dry them up. Wash sheets and towels in hot water with a little bleach.  Recheck if having uncontrollable itching, fever >100.5, mouth/eye/genital involvement, or if having new spots pop up after the next week or two.  Eustace MooreLaura W Moustafa Mossa, MD 11/11/15 1317  Eustace MooreLaura W Bianey Tesoro, MD 11/11/15 [phone]

## 2018-03-31 ENCOUNTER — Emergency Department (HOSPITAL_COMMUNITY): Payer: Self-pay

## 2018-03-31 ENCOUNTER — Emergency Department (HOSPITAL_COMMUNITY)
Admission: EM | Admit: 2018-03-31 | Discharge: 2018-03-31 | Disposition: A | Discharge status: Home / Self Care | Payer: Self-pay | Attending: Emergency Medicine | Admitting: Emergency Medicine

## 2018-03-31 ENCOUNTER — Encounter (HOSPITAL_COMMUNITY): Payer: Self-pay | Admitting: Emergency Medicine

## 2018-03-31 ENCOUNTER — Other Ambulatory Visit: Payer: Self-pay

## 2018-03-31 DIAGNOSIS — R197 Diarrhea, unspecified: Secondary | ICD-10-CM | POA: Insufficient documentation

## 2018-03-31 DIAGNOSIS — Z79899 Other long term (current) drug therapy: Secondary | ICD-10-CM | POA: Insufficient documentation

## 2018-03-31 DIAGNOSIS — R112 Nausea with vomiting, unspecified: Secondary | ICD-10-CM | POA: Insufficient documentation

## 2018-03-31 DIAGNOSIS — F17210 Nicotine dependence, cigarettes, uncomplicated: Secondary | ICD-10-CM | POA: Insufficient documentation

## 2018-03-31 DIAGNOSIS — R1033 Periumbilical pain: Secondary | ICD-10-CM | POA: Insufficient documentation

## 2018-03-31 LAB — COMPREHENSIVE METABOLIC PANEL
ALBUMIN: 4 g/dL (ref 3.5–5.0)
ALK PHOS: 107 U/L (ref 38–126)
ALT: 22 U/L (ref 0–44)
AST: 29 U/L (ref 15–41)
Anion gap: 12 (ref 5–15)
BUN: 12 mg/dL (ref 6–20)
CALCIUM: 9.2 mg/dL (ref 8.9–10.3)
CO2: 24 mmol/L (ref 22–32)
CREATININE: 0.96 mg/dL (ref 0.61–1.24)
Chloride: 103 mmol/L (ref 98–111)
GFR calc Af Amer: >60 mL/min (ref >60)
GFR calc non Af Amer: >60 mL/min (ref >60)
Glucose, Bld: 123 mg/dL — ABNORMAL HIGH (ref 70–99)
Potassium: 4.7 mmol/L (ref 3.5–5.1)
SODIUM: 139 mmol/L (ref 135–145)
Total Bilirubin: 1.5 mg/dL — ABNORMAL HIGH (ref 0.3–1.2)
Total Protein: 8.2 g/dL — ABNORMAL HIGH (ref 6.5–8.1)

## 2018-03-31 LAB — CBC WITH DIFFERENTIAL/PLATELET
ABS IMMATURE GRANULOCYTES: 0.1 10*3/uL — AB (ref 0.00–0.07)
BASOS ABS: 0 10*3/uL (ref 0.0–0.1)
Basophils Relative: 0 %
Eosinophils Absolute: 0 10*3/uL (ref 0.0–0.5)
Eosinophils Relative: 0 %
HCT: 51.9 % (ref 39.0–52.0)
HEMOGLOBIN: 16.2 g/dL (ref 13.0–17.0)
Immature Granulocytes: 0 %
LYMPHS ABS: 1.1 10*3/uL (ref 0.7–4.0)
LYMPHS PCT: 4 %
MCH: 27 pg (ref 26.0–34.0)
MCHC: 31.2 g/dL (ref 30.0–36.0)
MCV: 86.4 fL (ref 80.0–100.0)
MONO ABS: 1.6 10*3/uL — AB (ref 0.1–1.0)
Monocytes Relative: 6 %
NEUTROS ABS: 21.6 10*3/uL — AB (ref 1.7–7.7)
NRBC: 0 % (ref 0.0–0.2)
Neutrophils Relative %: 90 %
Platelets: 294 10*3/uL (ref 150–400)
RBC: 6.01 MIL/uL — AB (ref 4.22–5.81)
RDW: 13.6 % (ref 11.5–15.5)
WBC: 24.4 10*3/uL — ABNORMAL HIGH (ref 4.0–10.5)

## 2018-03-31 LAB — URINALYSIS, ROUTINE W REFLEX MICROSCOPIC
Bilirubin Urine: NEGATIVE
GLUCOSE, UA: NEGATIVE mg/dL
Hgb urine dipstick: NEGATIVE
KETONES UR: NEGATIVE mg/dL
Leukocytes, UA: NEGATIVE
Nitrite: NEGATIVE
PH: 5 (ref 5.0–8.0)
Protein, ur: NEGATIVE mg/dL
Specific Gravity, Urine: 1.025 (ref 1.005–1.030)

## 2018-03-31 LAB — LIPASE, BLOOD: Lipase: 24 U/L (ref 11–51)

## 2018-03-31 MED ORDER — DICYCLOMINE HCL 10 MG/ML IM SOLN
10.0000 mg | Freq: Once | INTRAMUSCULAR | Status: AC
  Administered 2018-03-31: 10 mg via INTRAMUSCULAR
  Filled 2018-03-31: qty 2

## 2018-03-31 MED ORDER — ONDANSETRON 4 MG PO TBDP: 4.0000 mg | ORAL_TABLET | Freq: Three times a day (TID) | ORAL | 0 refills | Status: AC | PRN

## 2018-03-31 MED ORDER — IOHEXOL 300 MG/ML  SOLN
100.0000 mL | Freq: Once | INTRAMUSCULAR | Status: AC | PRN
  Administered 2018-03-31: 100 mL via INTRAVENOUS

## 2018-03-31 MED ORDER — ONDANSETRON HCL 4 MG/2ML IJ SOLN
4.0000 mg | Freq: Once | INTRAMUSCULAR | Status: AC
  Administered 2018-03-31: 4 mg via INTRAVENOUS
  Filled 2018-03-31: qty 2

## 2018-03-31 MED ORDER — SODIUM CHLORIDE 0.9 % IV BOLUS
1000.0000 mL | Freq: Once | INTRAVENOUS | Status: AC
  Administered 2018-03-31: 1000 mL via INTRAVENOUS

## 2018-03-31 NOTE — ED Notes (Signed)
Discharge instructions and prescription discussed with Pt. Pt verbalized understanding. Pt stable and ambulatory.    

## 2018-03-31 NOTE — ED Triage Notes (Signed)
Pt arrives POV with c/o emesis and loose stools since 10am today. Reports daily marijuana use. Stating he is now having abdominal pain in his RLQ.

## 2018-03-31 NOTE — ED Notes (Signed)
IV team at bedside 

## 2018-03-31 NOTE — ED Notes (Signed)
Pt aware that a urine sample is needed.  

## 2018-03-31 NOTE — ED Notes (Signed)
Pt ambulated to the bathroom with steady gait.

## 2018-03-31 NOTE — Discharge Instructions (Addendum)
Evaluated today for abdominal pain, nausea and vomiting.  Your CT scan was negative.  Your labs did show you have an elevated white count, however this can be caused by many different things.  Prescribe you Zofran which you can take as needed for nausea and vomiting.  Please follow-up with your PCP for reevaluation.  Return to the ED for any new or worsening symptoms such as pain focalized to the right lower quadrant, persistent nausea and vomiting, blood in your vomit, blood in your stools, worsening abdominal pain.

## 2018-03-31 NOTE — ED Provider Notes (Signed)
[facility] EMERGENCY DEPARTMENT Provider Note   CSN: [id number] Arrival date & time: 03/31/18  1509   History   Chief Complaint Chief Complaint  Patient presents with  . Emesis    HPI Hector Porter is a 27 y.o. male with no significant medical history who presents for evaluation of nausea, vomiting and abdominal pain.  Patient states that approximately 10 AM this morning he had sudden onset nonbloody, nonbilious emesis.  Patient states his abdominal pain was located around the periumbilical region, however did have a "5-minute episode of right lower quadrant pain and upper abdominal pain." Patient states he has had 5 episodes of nonbloody diarrhea since he awoke this morning.  Patient has not been able to tolerate p.o. intake.  Admits to generalized anorexia since onset of symptoms stating "I dont want to eat if I'm sick."  Denies fever, chills, chest pain, shortness of breath, dysuria, testicular pain, suprapubic pain.  Has not taken anything for symptoms.  Denies aggravating or alleviating symptoms unless other was stated above.  Rates his abdominal pain a 6/10.  Denies radiation of pain.  Admits to daily marijuana use.  Denies previous abdominal surgeries.  History obtained from patient.  No interpreter was used.  HPI  History reviewed. No pertinent past medical history.  There are no active problems to display for this patient.   Past Surgical History:  Procedure Laterality Date  . JOINT REPLACEMENT          Home Medications    Prior to Admission medications   Medication Sig Start Date End Date Taking? Authorizing Provider  azithromycin (ZITHROMAX Z-PAK) 250 MG tablet Take 1 tablet (250 mg total) by mouth daily. 500mg  PO day 1, then 250mg  PO days 205 10/12/13   Roxy Horseman, PA-C  azithromycin [facility]) 250 MG tablet One by mouth daily 09/12/12   Doug Sou, MD  clobetasol ointment (TEMOVATE) 0.05 % Apply 1 application topically 2 (two) times  daily. 11/10/15   Isa Rankin, MD  ibuprofen (ADVIL,MOTRIN) 200 MG tablet Take 400 mg by mouth every 6 (six) hours as needed. For headache/pain    [provider]  ondansetron (ZOFRAN ODT) 4 MG disintegrating tablet Take 1 tablet (4 mg total) by mouth every 8 (eight) hours as needed for nausea or vomiting. 03/31/18   Alishah Schulte A, PA-C    Family History No family history on file.  Social History Social History   Tobacco Use  . Smoking status: Current Every Day Smoker    Types: Cigars  . Smokeless tobacco: Never Used  Substance Use Topics  . Alcohol use: Yes    Comment: weekends  . Drug use: Yes    Frequency: 7.0 times per week    Types: Marijuana     Allergies   Tylenol [acetaminophen]   Review of Systems Review of Systems  Constitutional: Negative.   Respiratory: Negative.   Cardiovascular: Negative.   Gastrointestinal: Positive for abdominal pain, diarrhea, nausea and vomiting. Negative for abdominal distention, anal bleeding, blood in stool, constipation and rectal pain.  Genitourinary: Negative.   Musculoskeletal: Negative.   Skin: Negative.   Neurological: Negative.   All other systems reviewed and are negative.    Physical Exam Updated Vital Signs BP (!) 149/90   Pulse (!) 101   Temp 98.6 F (37 C) (Oral)   Resp 18   SpO2 99%   Physical Exam  Constitutional: He appears well-developed and well-nourished. No distress.  HENT:  Head: Atraumatic.  Mouth/Throat: Oropharynx is clear and moist.  Eyes: Pupils are equal, round, and reactive to light.  Neck: Normal range of motion. Neck supple.  Cardiovascular: Normal rate, regular rhythm, normal heart sounds and intact distal pulses. Exam reveals no gallop and no friction rub.  No murmur heard. Pulmonary/Chest: Effort normal and breath sounds normal. No stridor. No respiratory distress. He has no rales. He exhibits no tenderness.  Abdominal: Soft. Normal appearance and bowel sounds are  normal. He exhibits no distension, no pulsatile liver, no fluid wave, no abdominal bruit, no ascites, no pulsatile midline mass and no mass. There is generalized tenderness and tenderness in the periumbilical area. There is no rigidity, no rebound, no guarding, no CVA tenderness and negative Murphy's sign.  Negative psoas, obturator sign.  Musculoskeletal: Normal range of motion.  Neurological: He is alert.  Skin: Skin is warm and dry. He is not diaphoretic.  Psychiatric: He has a normal mood and affect.  Nursing note and vitals reviewed.    ED Treatments / Results  Labs (all labs ordered are listed, but only abnormal results are displayed) Labs Reviewed  CBC WITH DIFFERENTIAL/PLATELET - Abnormal; Notable for the following components:      Result Value   WBC 24.4 (*)    RBC 6.01 (*)    Neutro Abs 21.6 (*)    Monocytes Absolute 1.6 (*)    Abs Immature Granulocytes 0.10 (*)    All other components within normal limits  COMPREHENSIVE METABOLIC PANEL - Abnormal; Notable for the following components:   Glucose, Bld 123 (*)    Total Protein 8.2 (*)    Total Bilirubin 1.5 (*)    All other components within normal limits  URINALYSIS, ROUTINE W REFLEX MICROSCOPIC  LIPASE, BLOOD    EKG None  Radiology Ct Abdomen Pelvis W Contrast  Result Date: 03/31/2018 CLINICAL DATA:  Acute onset of paraumbilical and right lower quadrant abdominal pain this morning. Vomiting. Loose stools. EXAM: CT ABDOMEN AND PELVIS WITH CONTRAST TECHNIQUE: Multidetector CT imaging of the abdomen and pelvis was performed using the standard protocol following bolus administration of intravenous contrast. CONTRAST:  OMNIPAQUE IOHEXOL 300 MG/ML  SOLN COMPARISON:  None. FINDINGS: Lower Chest: No acute findings. Hepatobiliary: No hepatic masses identified. Mild hepatic steatosis. Gallbladder is unremarkable. Pancreas:  No mass or inflammatory changes. Spleen: Within normal limits in size and appearance.  Adrenals/Urinary Tract: No masses identified. No evidence of hydronephrosis. Stomach/Bowel: No evidence of obstruction, inflammatory process or abnormal fluid collections. Normal appendix visualized. Vascular/Lymphatic: No pathologically enlarged lymph nodes. No abdominal aortic aneurysm. Reproductive:  No mass or other significant abnormality. Other:  None. Musculoskeletal:  No suspicious bone lesions identified. IMPRESSION: No acute findings. Mild hepatic steatosis. Electronically Signed   By: Myles Rosenthal M.D.   On: 03/31/2018 20:07    Procedures Procedures (including critical care time)  Medications Ordered in ED Medications  sodium chloride 0.9 % bolus 1,000 mL (0 mLs Intravenous Stopped 03/31/18 1934)  ondansetron (ZOFRAN) injection 4 mg (4 mg Intravenous Given 03/31/18 1822)  dicyclomine (BENTYL) injection 10 mg (10 mg Intramuscular Given 03/31/18 1822)  iohexol (OMNIPAQUE) 300 MG/ML solution 100 mL (100 mLs Intravenous Contrast Given 03/31/18 1937)     Initial Impression / Assessment and Plan / ED Course  I have reviewed the triage vital signs and the nursing notes.  Pertinent labs & imaging results that were available during my care of the patient were reviewed by me and considered in my medical decision making (see chart  for details).  27 year old male who appears otherwise well presents for evaluation of nausea, vomiting and abdominal pain.  Symptom onset approximately 10 AM this morning.  Abdomen with mild tenderness to periumbilical region.  Negative of psoas, obturator sign.  Mucous membranes moist.  Has not been able to tolerate p.o. Intake since symptom onset.  Has had approximately 5 episodes of nonbloody, nonbilious emesis as well as non-bloody diarrhea since onset of symptoms this morning.  Patient is a daily marijuana user.  Will obtain labs, urine and CT scan and reevaluate.  Labs with leukocytosis at 24, metabolic panel with mildly elevated glucose at 123, lipase 24, urinalysis  negative for infection.  CT scan negative for acute pathology.  Patient voices improvement in nausea, vomiting and abdominal pain with fluids, Zofran and Bentyl.  On reevaluation abdomen nontender without rebound, rigidity or guarding.  Patient requesting DC home at this time stating "I feel better."  Denies any nausea, vomiting or abdominal pain on reevaluation.  Patient is nontoxic, nonseptic appearing, in no apparent distress.  Patient's pain and other symptoms adequately managed in emergency department.  Fluid bolus given.  Patient able to tolerate p.o. intake without difficulty. Patient does not meet the SIRS or Sepsis criteria.  On repeat exam patient does not have a surgical abdomin and there are no peritoneal signs.  No indication of appendicitis, bowel obstruction, bowel perforation, cholecystitis, diverticulitis.  Patient discharged home with symptomatic treatment and given strict instructions for follow-up with their primary care physician.  Patient is hemodynamically stable and appropriate for DC home at this time.  I have also discussed reasons to return immediately to the ER.  Patient expresses understanding and agrees with plan.    Final Clinical Impressions(s) / ED Diagnoses   Final diagnoses:  Nausea vomiting and diarrhea  Periumbilical abdominal pain    ED Discharge Orders         Ordered    ondansetron (ZOFRAN ODT) 4 MG disintegrating tablet  Every 8 hours PRN     03/31/18 2032           Beatriz Quintela A, PA-C 03/31/18 2058    Sabas SousBero, Michael M, MD 04/01/18 1055

## 2018-03-31 NOTE — ED Notes (Signed)
Pt given water 

## 2018-03-31 NOTE — ED Notes (Signed)
Patient transported to CT 

## 2020-03-03 ENCOUNTER — Encounter (HOSPITAL_COMMUNITY): Payer: Self-pay | Admitting: *Deleted

## 2020-03-03 ENCOUNTER — Other Ambulatory Visit: Payer: Self-pay

## 2020-03-03 ENCOUNTER — Emergency Department (HOSPITAL_COMMUNITY)
Admission: EM | Admit: 2020-03-03 | Discharge: 2020-03-03 | Disposition: A | Discharge status: Home / Self Care | Payer: Self-pay | Attending: Emergency Medicine | Admitting: Emergency Medicine

## 2020-03-03 DIAGNOSIS — Z96659 Presence of unspecified artificial knee joint: Secondary | ICD-10-CM | POA: Insufficient documentation

## 2020-03-03 DIAGNOSIS — F17290 Nicotine dependence, other tobacco product, uncomplicated: Secondary | ICD-10-CM | POA: Insufficient documentation

## 2020-03-03 DIAGNOSIS — K0889 Other specified disorders of teeth and supporting structures: Secondary | ICD-10-CM | POA: Insufficient documentation

## 2020-03-03 MED ORDER — PENICILLIN V POTASSIUM 250 MG PO TABS
500.0000 mg | ORAL_TABLET | Freq: Once | ORAL | Status: AC
  Administered 2020-03-03: 500 mg via ORAL
  Filled 2020-03-03: qty 2

## 2020-03-03 MED ORDER — PENICILLIN V POTASSIUM 500 MG PO TABS: 500.0000 mg | ORAL_TABLET | Freq: Four times a day (QID) | ORAL | 0 refills | Status: AC

## 2020-03-03 MED ORDER — LORATADINE 10 MG PO TABS
10.0000 mg | ORAL_TABLET | Freq: Once | ORAL | Status: AC
  Administered 2020-03-03: 10 mg via ORAL
  Filled 2020-03-03: qty 1

## 2020-03-03 MED ORDER — IBUPROFEN 800 MG PO TABS
800.0000 mg | ORAL_TABLET | Freq: Once | ORAL | Status: AC
  Administered 2020-03-03: 800 mg via ORAL
  Filled 2020-03-03: qty 1

## 2020-03-03 NOTE — ED Provider Notes (Signed)
[facility] EMERGENCY DEPARTMENT Provider Note   CSN: [id number] Arrival date & time: 03/03/20  0022     History Chief Complaint  Patient presents with  . Dental Pain    Hector Porter is a 29 y.o. male.  The history is provided by the patient.  Dental Pain Location:  Lower Lower teeth location:  31/RL 2nd molar, 32/RL 3rd molar and 30/RL 1st molar Quality:  Aching Severity:  Moderate Onset quality:  Gradual Timing:  Constant Progression:  Unchanged Chronicity:  New Context: not crown fracture, filling intact, not intrusion, not malocclusion and not recent dental surgery   Prior workup: none. Relieved by:  Nothing Ineffective treatments: BC powder  Associated symptoms: no difficulty swallowing, no drooling, no facial swelling, no fever, no oral bleeding and no trismus   Risk factors: no alcohol problem   States he feels like the right jaw line is swollen.  No f/c/r.  Normal mouth opening speaking normally no drooling.  No under chill redness nor swelling.      History reviewed. No pertinent past medical history.  There are no problems to display for this patient.   Past Surgical History:  Procedure Laterality Date  . JOINT REPLACEMENT         History reviewed. No pertinent family history.  Social History   Tobacco Use  . Smoking status: Current Every Day Smoker    Types: Cigars  . Smokeless tobacco: Never Used  Substance Use Topics  . Alcohol use: Yes    Comment: weekends  . Drug use: Yes    Frequency: 7.0 times per week    Types: Marijuana    Home Medications Prior to Admission medications   Medication Sig Start Date End Date Taking? Authorizing Provider  azithromycin (ZITHROMAX Z-PAK) 250 MG tablet Take 1 tablet (250 mg total) by mouth daily. 500mg  PO day 1, then 250mg  PO days 205 10/12/13   , PA-C  azithromycin [facility]) 250 MG tablet One by mouth daily 09/12/12   [facility], MD  clobetasol ointment  (TEMOVATE) 0.05 % Apply 1 application topically 2 (two) times daily. 11/10/15   Doug Sou, MD  ibuprofen (ADVIL,MOTRIN) 200 MG tablet Take 400 mg by mouth every 6 (six) hours as needed. For headache/pain    [provider]  ondansetron (ZOFRAN ODT) 4 MG disintegrating tablet Take 1 tablet (4 mg total) by mouth every 8 (eight) hours as needed for nausea or vomiting. 03/31/18   Henderly, Britni A, PA-C    Allergies    Tylenol [acetaminophen]  Review of Systems   Review of Systems  Constitutional: Negative for fever.  HENT: Positive for dental problem. Negative for drooling, facial swelling, trouble swallowing and voice change.   Eyes: Negative for visual disturbance.  Respiratory: Negative for shortness of breath.   Cardiovascular: Negative for chest pain.  Gastrointestinal: Negative for abdominal pain.  Genitourinary: Negative for difficulty urinating.  Musculoskeletal: Negative for arthralgias.  Skin: Negative for rash.  Neurological: Negative for dizziness.  Psychiatric/Behavioral: Negative for agitation.  All other systems reviewed and are negative.   Physical Exam Updated Vital Signs BP (!) 188/117 (BP Location: Right Arm)   Pulse 83   Temp 98.7 F (37.1 C) (Oral)   Resp 16   SpO2 99%   Physical Exam Vitals and nursing note reviewed.  Constitutional:      General: He is not in acute distress.    Appearance: Normal appearance.  HENT:     Head:  Normocephalic and atraumatic.     Nose: Nose normal.     Mouth/Throat:     Mouth: Mucous membranes are moist.     Dentition: Abnormal dentition.     Pharynx: Oropharynx is clear.     Comments: No swelling of the face, no swelling of the neck, no warmth,  No swelling of the lips tongue or uvula, no posterior oropharyngeal swelling  Eyes:     Pupils: Pupils are equal, round, and reactive to light.  Cardiovascular:     Rate and Rhythm: Normal rate and regular rhythm.     Pulses: Normal pulses.     Heart  sounds: Normal heart sounds.  Pulmonary:     Effort: Pulmonary effort is normal.     Breath sounds: Normal breath sounds. No stridor.  Abdominal:     General: Abdomen is flat. Bowel sounds are normal.     Palpations: Abdomen is soft.     Tenderness: There is no abdominal tenderness. There is no guarding.  Musculoskeletal:        General: Normal range of motion.     Cervical back: Normal range of motion and neck supple. No rigidity or tenderness.  Lymphadenopathy:     Cervical: No cervical adenopathy.  Skin:    General: Skin is warm and dry.     Capillary Refill: Capillary refill takes less than 2 seconds.  Neurological:     General: No focal deficit present.     Mental Status: He is alert and oriented to person, place, and time.     Deep Tendon Reflexes: Reflexes normal.  Psychiatric:        Mood and Affect: Mood normal.        Behavior: Behavior normal.     ED Results / Procedures / Treatments   Labs (all labs ordered are listed, but only abnormal results are displayed) Labs Reviewed - No data to display  EKG None  Radiology No results found.  Procedures Procedures (including critical care time)  Medications Ordered in ED Medications  penicillin v potassium (VEETID) tablet 500 mg (has no administration in time range)  ibuprofen (ADVIL) tablet 800 mg (has no administration in time range)    ED Course  I have reviewed the triage vital signs and the nursing notes.  Pertinent labs & imaging results that were available during my care of the patient were reviewed by me and considered in my medical decision making (see chart for details).    Patient has an unknown allergy to children's tylenol.  I have advised him not to take [facility] or goody powders given the tylenol content.  I will start PCN.  There is no swelling of the face nor the neck nor the oropharynx.  Patient is stable for discharge with close follow up.  Dental resource guide given.  Take all antibiotics.  Strict  return precautions given.    Hector Porter was evaluated in Emergency Department on 03/03/2020 for the symptoms described in the history of present illness. He was evaluated in the context of the global COVID-19 pandemic, which necessitated consideration that the patient might be at risk for infection with the SARS-CoV-2 virus that causes COVID-19. Institutional protocols and algorithms that pertain to the evaluation of patients at risk for COVID-19 are in a state of rapid change based on information released by regulatory bodies including the CDC and federal and state organizations. These policies and algorithms were followed during the patient's care in the ED.  Final Clinical  Impression(s) / ED Diagnoses Return for intractable cough, coughing up blood,fevers >100.4 unrelieved by medication, shortness of breath, intractable vomiting, chest pain, shortness of breath, weakness,numbness, changes in speech, facial asymmetry,abdominal pain, passing out,Inability to tolerate liquids or food, cough, altered mental status or any concerns. No signs of systemic illness or infection. The patient is nontoxic-appearing on exam and vital signs are within normal limits.   I have reviewed the triage vital signs and the nursing notes. Pertinent labs &imaging results that were available during my care of the patient were reviewed by me and considered in my medical decision making (see chart for details).After history, exam, and medical workup I feel the patient has beenappropriately medically screened and is safe for discharge home. Pertinent diagnoses were discussed with the patient. Patient was given return precautions.   Oren Barella, MD 03/03/20 [phone]

## 2020-03-03 NOTE — ED Triage Notes (Signed)
Pt states right dental pain since yesterday with swelling in his jaw and neck area. Denies fevers.

## 2020-03-31 ENCOUNTER — Other Ambulatory Visit: Payer: Self-pay

## 2020-03-31 ENCOUNTER — Emergency Department (HOSPITAL_COMMUNITY)
Admission: EM | Admit: 2020-03-31 | Discharge: 2020-04-01 | Disposition: A | Discharge status: Home / Self Care | Payer: Self-pay | Attending: Emergency Medicine | Admitting: Emergency Medicine

## 2020-03-31 ENCOUNTER — Encounter (HOSPITAL_COMMUNITY): Payer: Self-pay

## 2020-03-31 DIAGNOSIS — R221 Localized swelling, mass and lump, neck: Secondary | ICD-10-CM | POA: Insufficient documentation

## 2020-03-31 DIAGNOSIS — K0889 Other specified disorders of teeth and supporting structures: Secondary | ICD-10-CM | POA: Insufficient documentation

## 2020-03-31 DIAGNOSIS — Z5321 Procedure and treatment not carried out due to patient leaving prior to being seen by health care provider: Secondary | ICD-10-CM | POA: Insufficient documentation

## 2020-03-31 LAB — CBC WITH DIFFERENTIAL/PLATELET
Abs Immature Granulocytes: 0.08 10*3/uL — ABNORMAL HIGH (ref 0.00–0.07)
Basophils Absolute: 0.1 10*3/uL (ref 0.0–0.1)
Basophils Relative: 0 %
Eosinophils Absolute: 0.1 10*3/uL (ref 0.0–0.5)
Eosinophils Relative: 1 %
HCT: 48.6 % (ref 39.0–52.0)
Hemoglobin: 16.1 g/dL (ref 13.0–17.0)
Immature Granulocytes: 1 %
Lymphocytes Relative: 17 %
Lymphs Abs: 2.6 10*3/uL (ref 0.7–4.0)
MCH: 28.8 pg (ref 26.0–34.0)
MCHC: 33.1 g/dL (ref 30.0–36.0)
MCV: 86.9 fL (ref 80.0–100.0)
Monocytes Absolute: 0.7 10*3/uL (ref 0.1–1.0)
Monocytes Relative: 4 %
Neutro Abs: 12.1 10*3/uL — ABNORMAL HIGH (ref 1.7–7.7)
Neutrophils Relative %: 77 %
Platelets: 235 10*3/uL (ref 150–400)
RBC: 5.59 MIL/uL (ref 4.22–5.81)
RDW: 13 % (ref 11.5–15.5)
WBC: 15.6 10*3/uL — ABNORMAL HIGH (ref 4.0–10.5)
nRBC: 0 % (ref 0.0–0.2)

## 2020-03-31 LAB — BASIC METABOLIC PANEL
Anion gap: 13 (ref 5–15)
BUN: 11 mg/dL (ref 6–20)
CO2: 23 mmol/L (ref 22–32)
Calcium: 9.6 mg/dL (ref 8.9–10.3)
Chloride: 103 mmol/L (ref 98–111)
Creatinine, Ser: 0.83 mg/dL (ref 0.61–1.24)
GFR, Estimated: >60 mL/min (ref >60)
Glucose, Bld: 95 mg/dL (ref 70–99)
Potassium: 4.1 mmol/L (ref 3.5–5.1)
Sodium: 139 mmol/L (ref 135–145)

## 2020-03-31 NOTE — ED Triage Notes (Signed)
Pt reports right lower dental pain with swelling down his neck, denies any difficulty breathing but states it is painful to swallow.

## 2020-04-01 NOTE — ED Notes (Signed)
Pt leaving AMA. Advised to return if symptoms worsen. 

## 2020-06-16 IMAGING — CT CT ABD-PELV W/ CM
2 of 4 series · 17 of 46 positions shown, 19 images · IV contrast (APPLIED)
Comparison: None.

CLINICAL DATA: Acute onset of paraumbilical and right lower
quadrant abdominal pain this morning. Vomiting. Loose stools.

EXAM:
CT ABDOMEN AND PELVIS WITH CONTRAST
TECHNIQUE: Multidetector CT imaging of the abdomen and pelvis was performed
using the standard protocol following bolus administration of
intravenous contrast.
CONTRAST:  100mL OMNIPAQUE IOHEXOL 300 MG/ML  SOLN

[Series 3: abdomen 5.0 · axial · 0.98mm/px · z∈[-658,-173]mm · 14 of 111 slices shown, 16 images]
[im 7/111  soft-tissue]
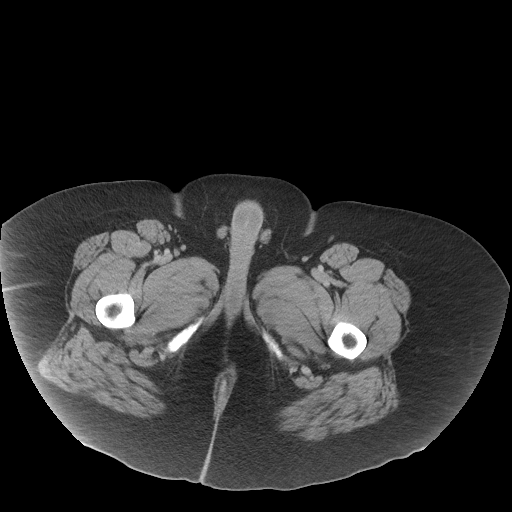
[im 7/111  bone]
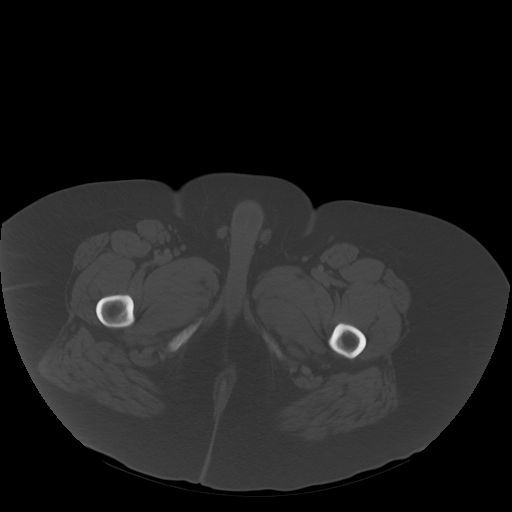
[im 13/111  soft-tissue]
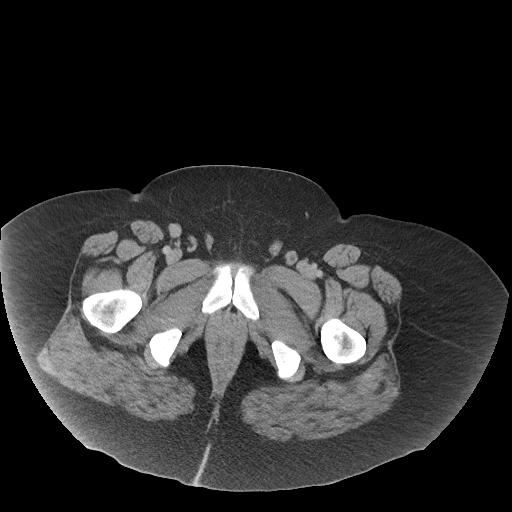
[im 20/111  soft-tissue]
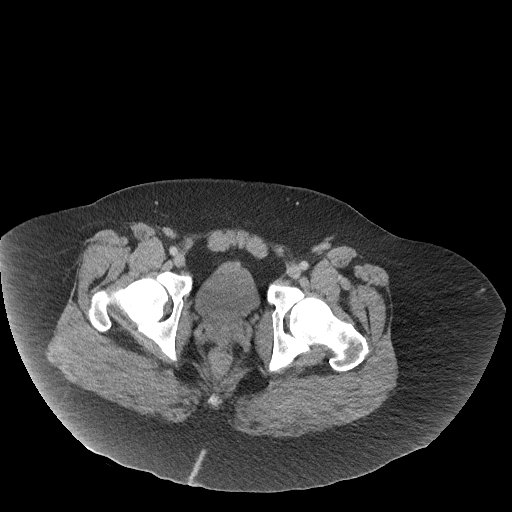
[im 33/111  soft-tissue]
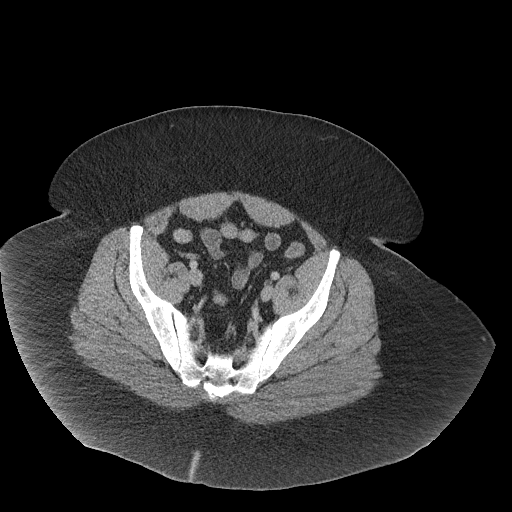
[im 39/111  soft-tissue]
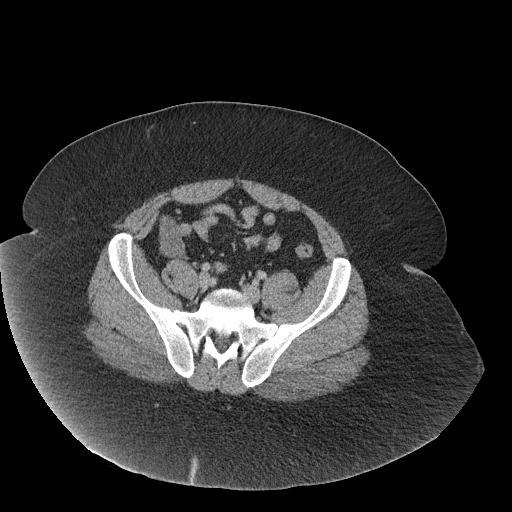
[im 46/111  soft-tissue]
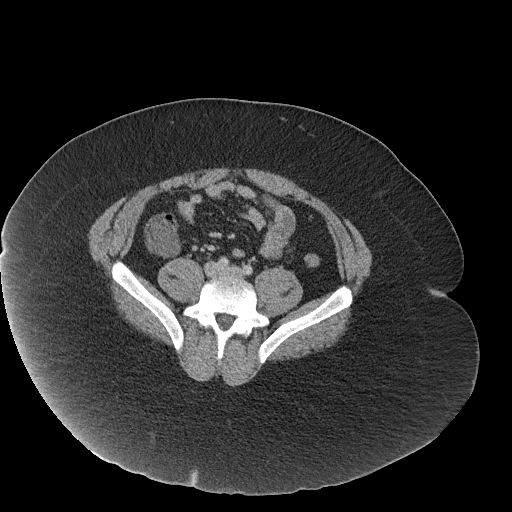
[im 52/111  soft-tissue]
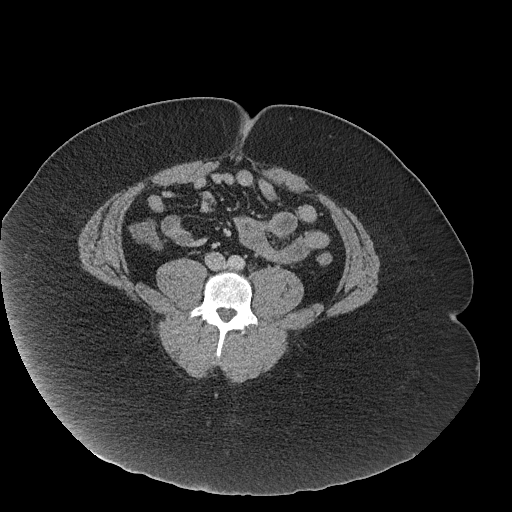
[im 59/111  soft-tissue]
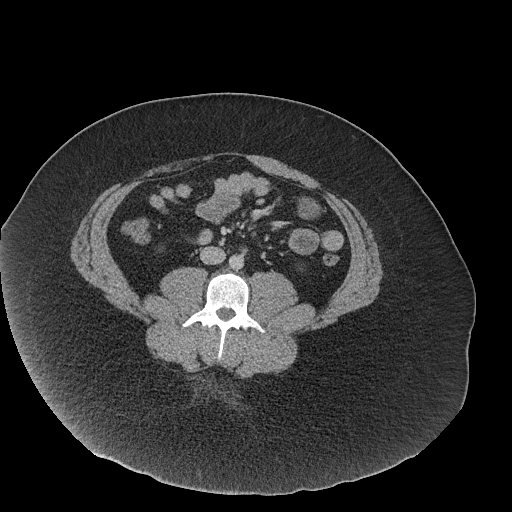
[im 65/111  soft-tissue]
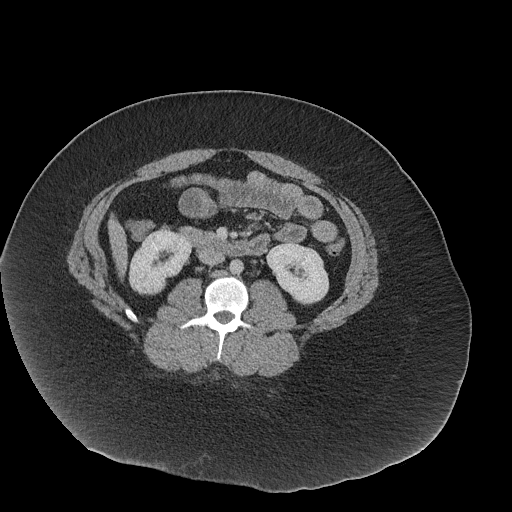
[im 65/111  bone]
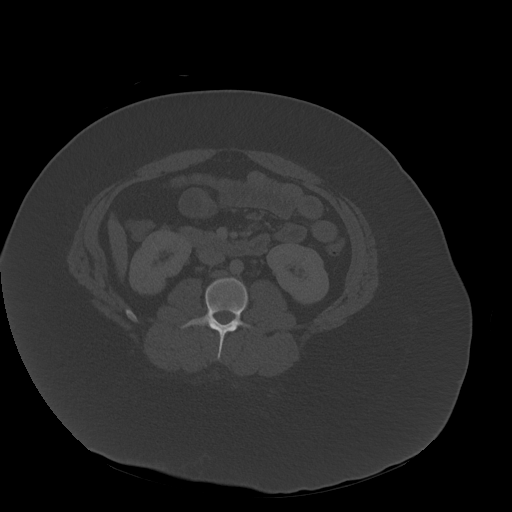
[im 72/111  soft-tissue]
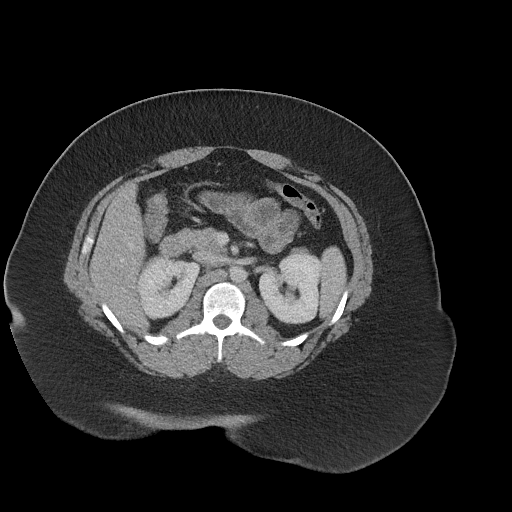
[im 85/111  soft-tissue]
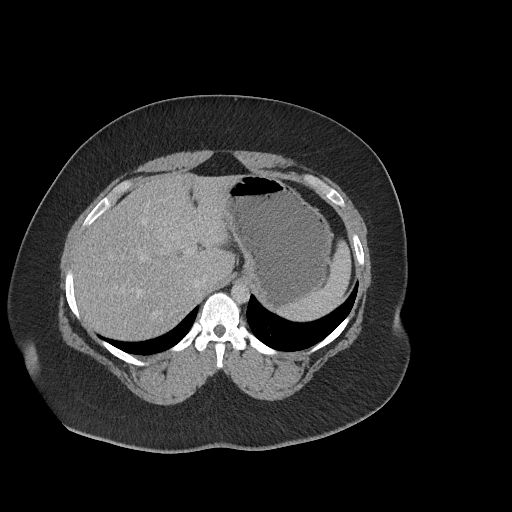
[im 91/111  soft-tissue]
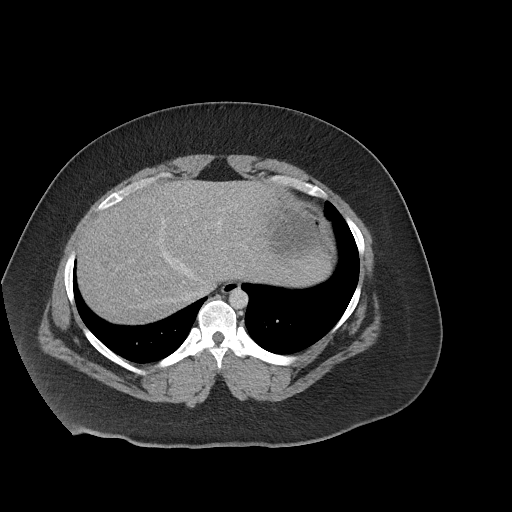
[im 98/111  soft-tissue]
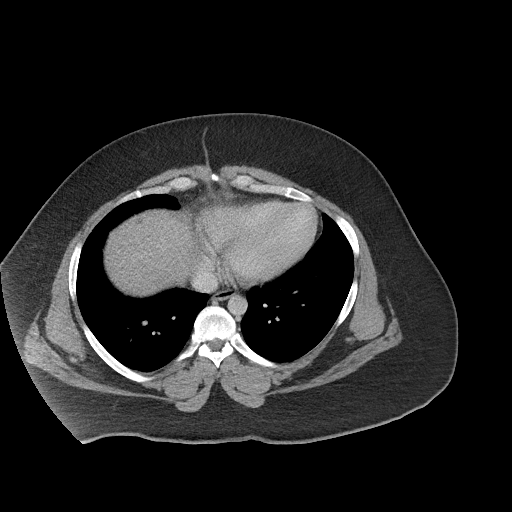
[im 104/111  soft-tissue]
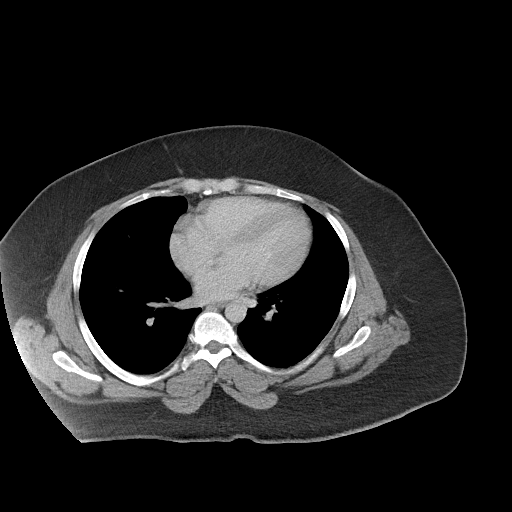

[Series 6: abdomen 3.0 mpr cor · coronal · 1.08mm/px · 3 of 132 slices shown]
[im 44/132  soft-tissue]
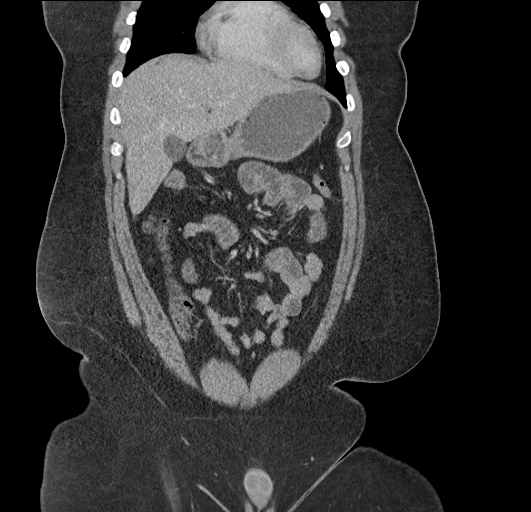
[im 59/132  soft-tissue]
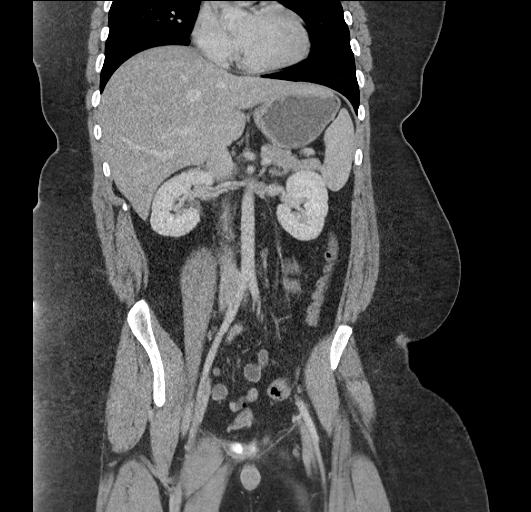
[im 73/132  soft-tissue]
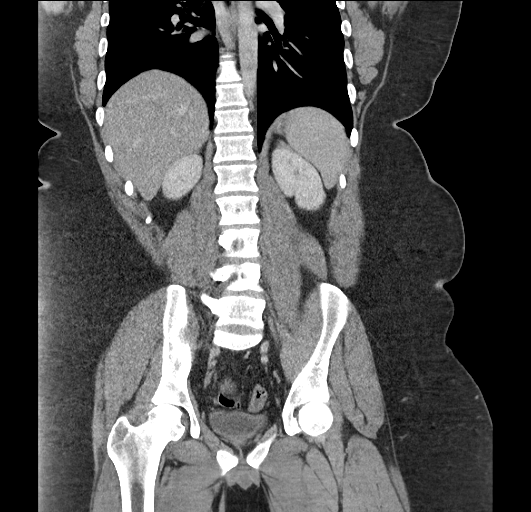

[17 of 46 positions shown; findings below may reference images not displayed]

FINDINGS: Lower Chest: No acute findings.

Hepatobiliary: No hepatic masses identified. Mild hepatic steatosis.
Gallbladder is unremarkable.

Pancreas:  No mass or inflammatory changes.

Spleen: Within normal limits in size and appearance.

Adrenals/Urinary Tract: No masses identified. No evidence of
hydronephrosis.

Stomach/Bowel: No evidence of obstruction, inflammatory process or
abnormal fluid collections. Normal appendix visualized.

Vascular/Lymphatic: No pathologically enlarged lymph nodes. No
abdominal aortic aneurysm.

Reproductive:  No mass or other significant abnormality.

Other:  None.

Musculoskeletal:  No suspicious bone lesions identified.
IMPRESSION: No acute findings.

Mild hepatic steatosis.
